# Patient Record
Sex: Male | Born: 1965 | Race: Black or African American | Hispanic: No
Health system: Southern US, Community
[De-identification: ages and names within clinical notes are randomized; demographics above are authoritative.]

---

## 2009-04-17 ENCOUNTER — Encounter: Admission: RE | Admit: 2009-04-17 | Discharge: 2009-04-17 | Payer: Self-pay | Admitting: Internal Medicine

## 2011-02-07 IMAGING — CR DG CHEST 1V
1 series · 1 of 1 positions shown · non-contrast
Comparison: None

CLINICAL DATA: Pre-employment physical.

CHEST - 1 VIEW

[view not recorded]
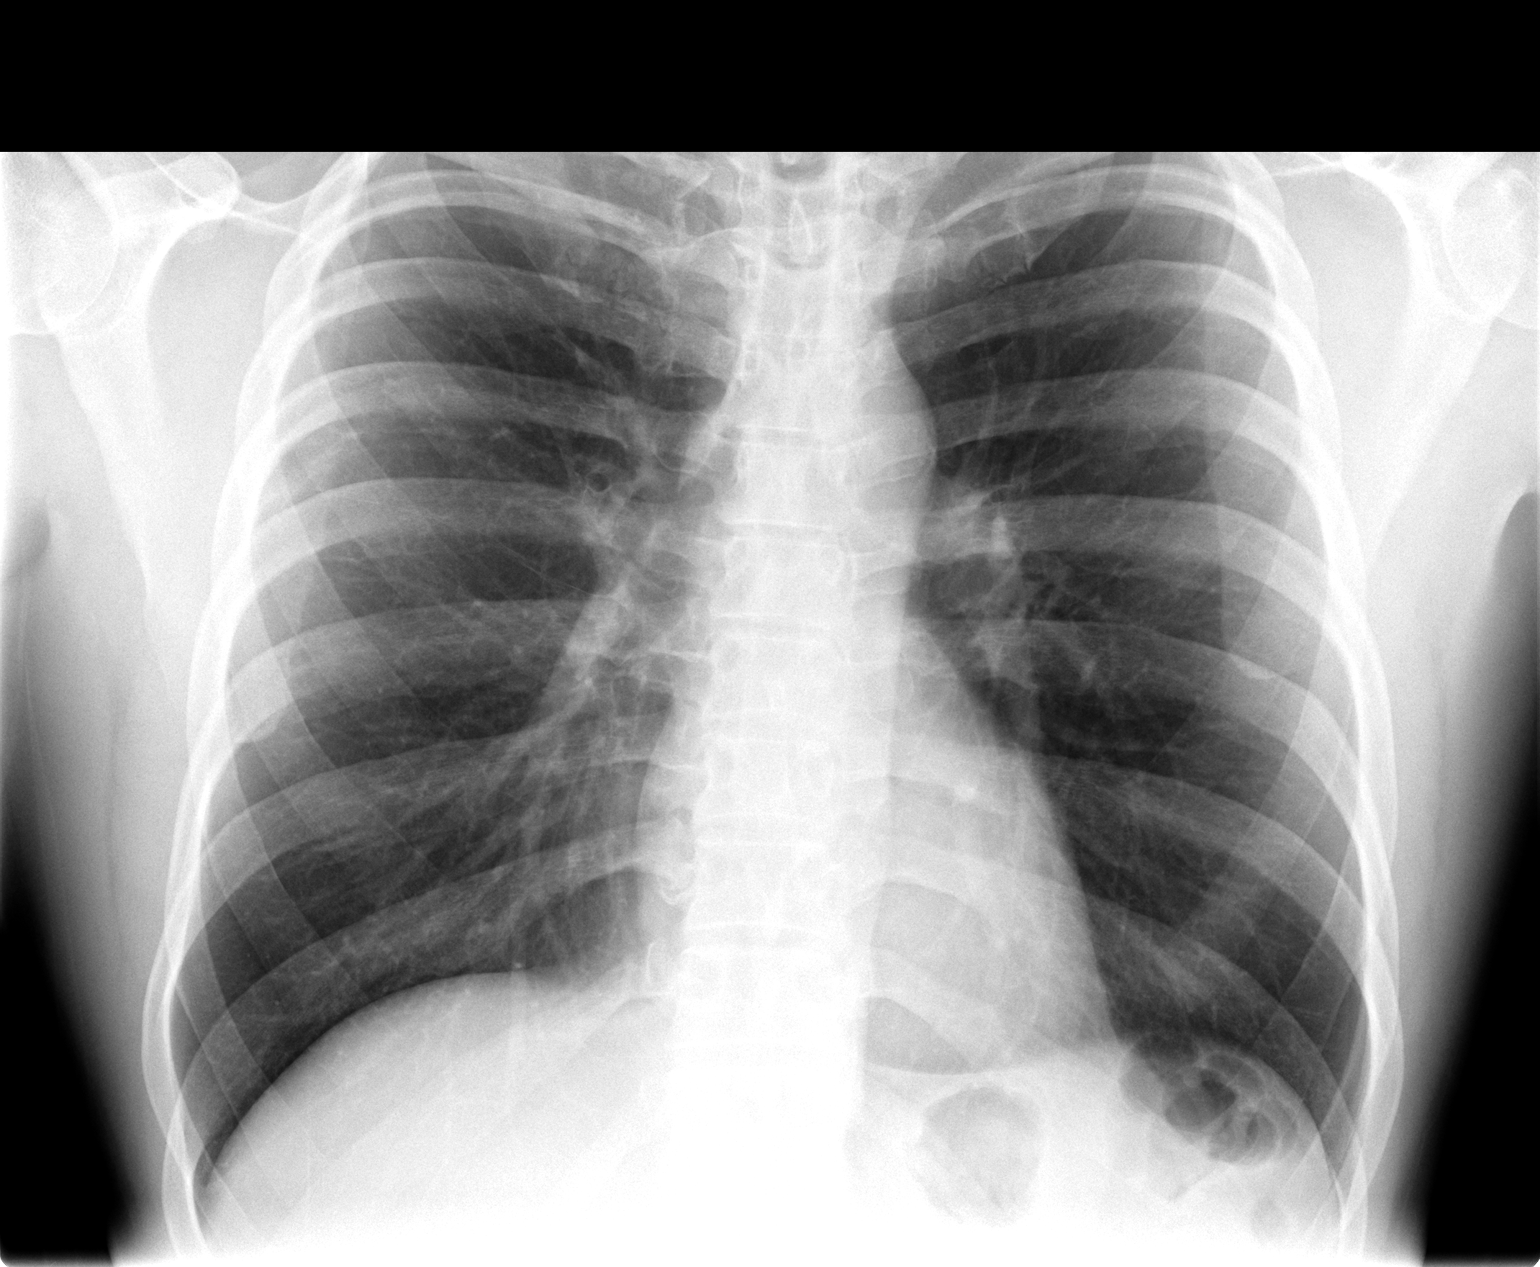

[1 of 1 positions shown; findings below may reference images not displayed]

FINDINGS: The cardiomediastinal silhouette is unremarkable.
The lungs are clear.
No evidence of focal airspace disease, pleural effusion, or
pneumothorax noted.
No acute bony abnormalities are identified.
IMPRESSION: No evidence of active cardiopulmonary disease.

## 2019-04-13 ENCOUNTER — Other Ambulatory Visit: Payer: Self-pay

## 2019-04-13 DIAGNOSIS — Z20822 Contact with and (suspected) exposure to covid-19: Secondary | ICD-10-CM

## 2019-04-17 LAB — NOVEL CORONAVIRUS, NAA: SARS-CoV-2, NAA: NOT DETECTED

## 2019-04-19 ENCOUNTER — Telehealth: Payer: Self-pay

## 2019-04-19 NOTE — Telephone Encounter (Signed)
Patient called in and received his test results °

## 2023-05-18 DIAGNOSIS — D649 Anemia, unspecified: Secondary | ICD-10-CM | POA: Diagnosis not present

## 2023-05-18 DIAGNOSIS — I10 Essential (primary) hypertension: Secondary | ICD-10-CM | POA: Diagnosis not present

## 2023-05-18 DIAGNOSIS — E7800 Pure hypercholesterolemia, unspecified: Secondary | ICD-10-CM | POA: Diagnosis not present
